# Patient Record
Sex: Female | Born: 1990 | Race: White | Hispanic: No | Marital: Single | Smoking: Never smoker
Health system: Southern US, Community
[De-identification: ages and names within clinical notes are randomized; demographics above are authoritative.]

## PROBLEM LIST (undated history)

## (undated) HISTORY — PX: APPENDECTOMY: SHX54

---

## 2010-03-30 ENCOUNTER — Ambulatory Visit: Payer: Self-pay | Admitting: Internal Medicine

## 2011-09-29 IMAGING — CR DG SHOULDER 3+V*L*
1 series · 3 of 3 positions shown · non-contrast
Comparison: none

REASON FOR EXAM: painful, limited ROM
COMMENTS:

PROCEDURE:     MDR - MDR SHOULDER LEFT COMPLETE  - March 30, 2010 [DATE]
RESULT:     Three views of the left shoulder are submitted. The bones appear
adequately mineralized. I do not see evidence of an acute fracture. The
overlying soft tissues are normal in appearance.

[Series 1: view not recorded · 0.17mm/px · 3 of 3 slices shown]
[im 1/3]
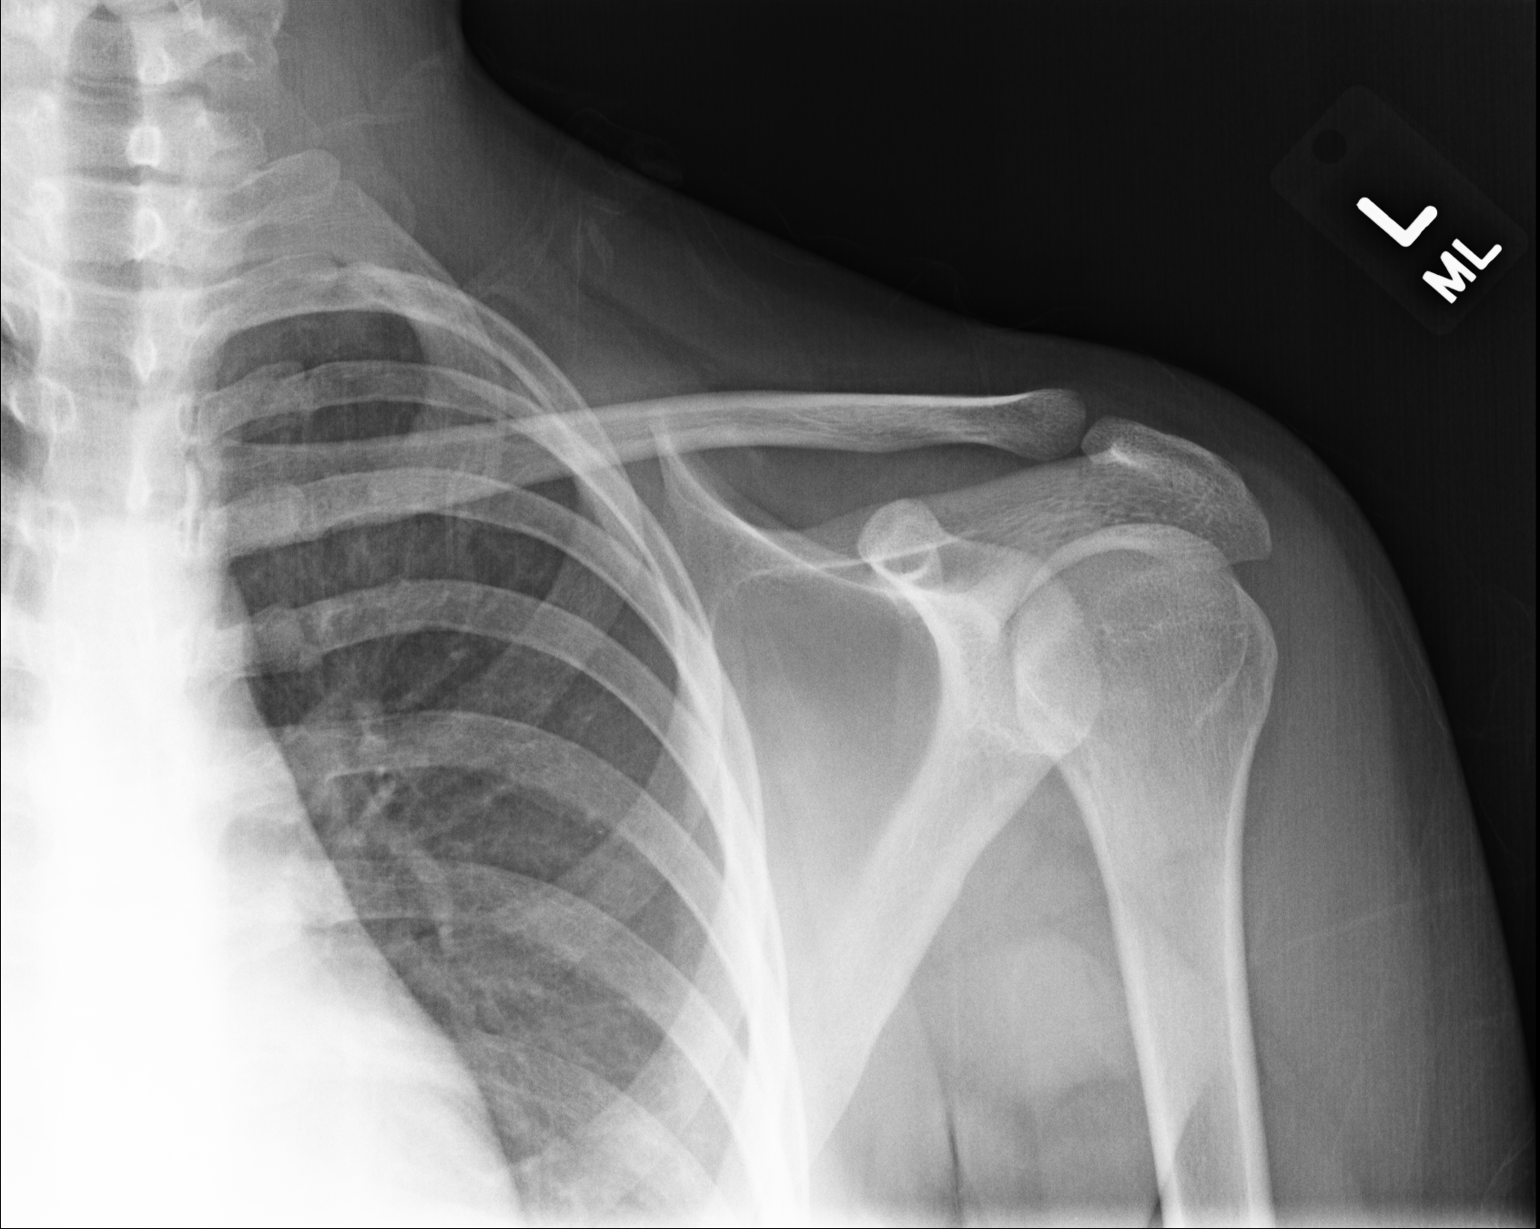
[im 2/3]
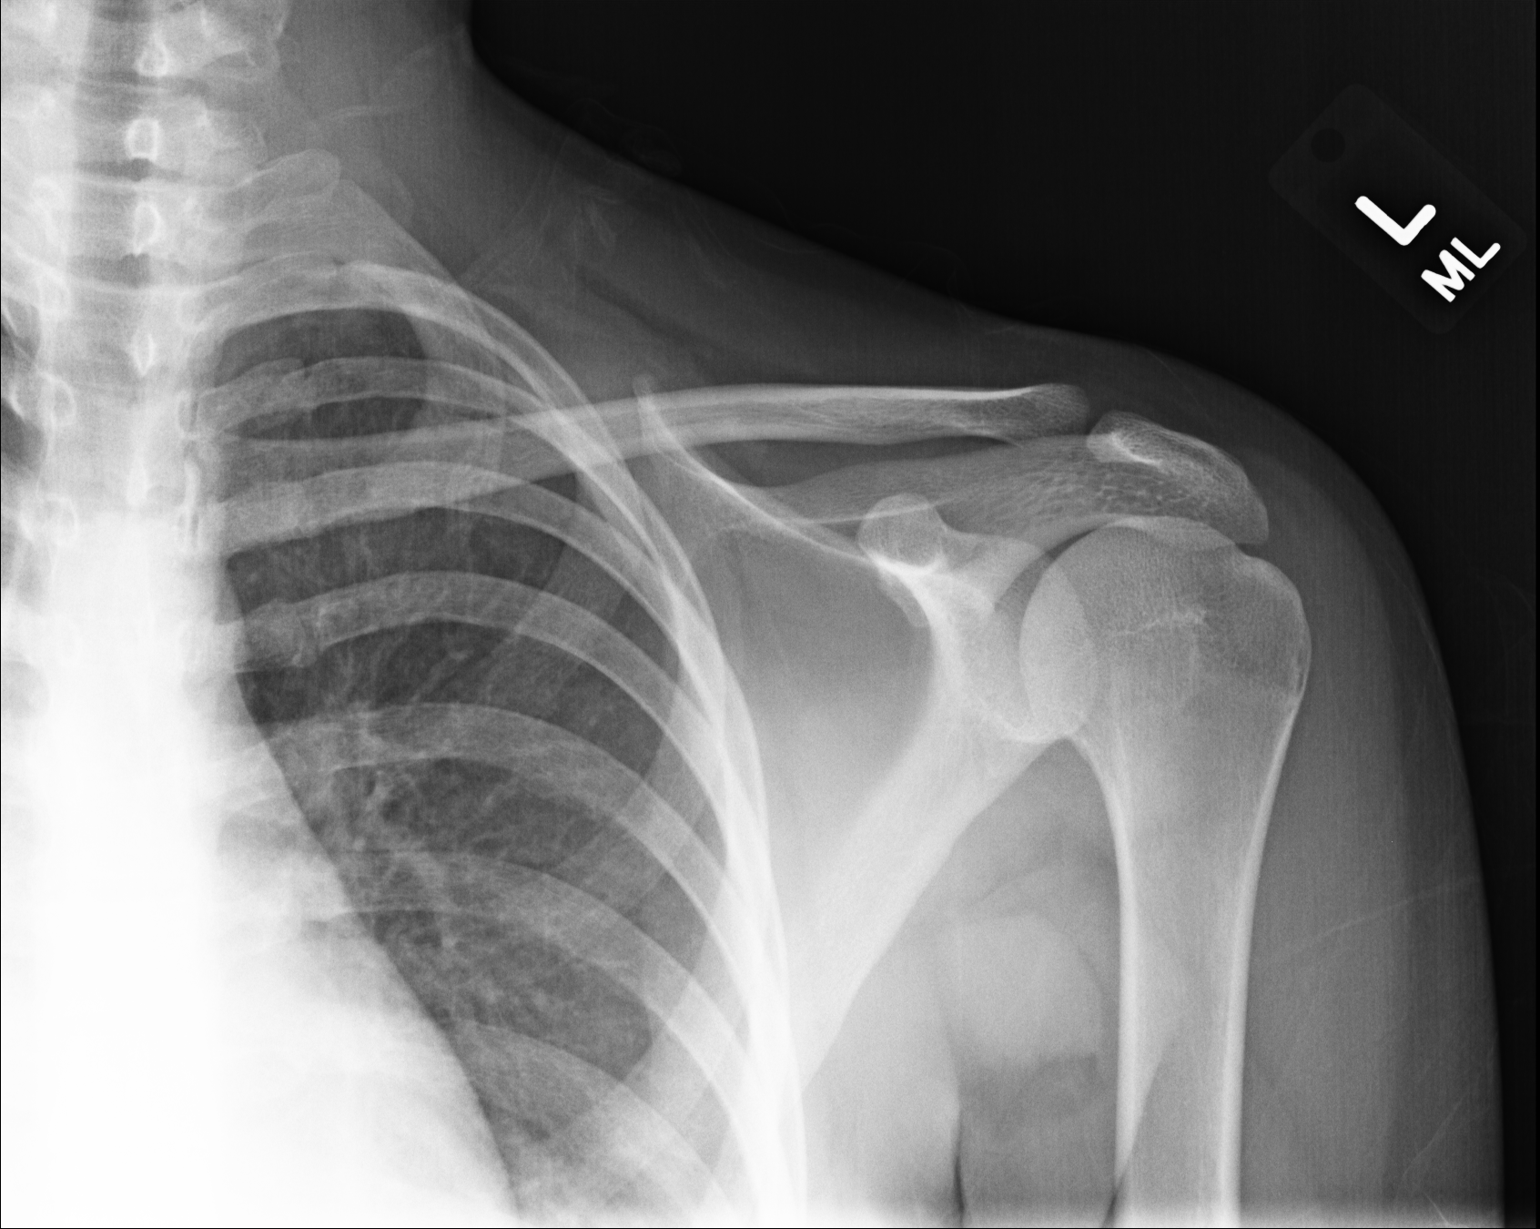
[im 3/3]
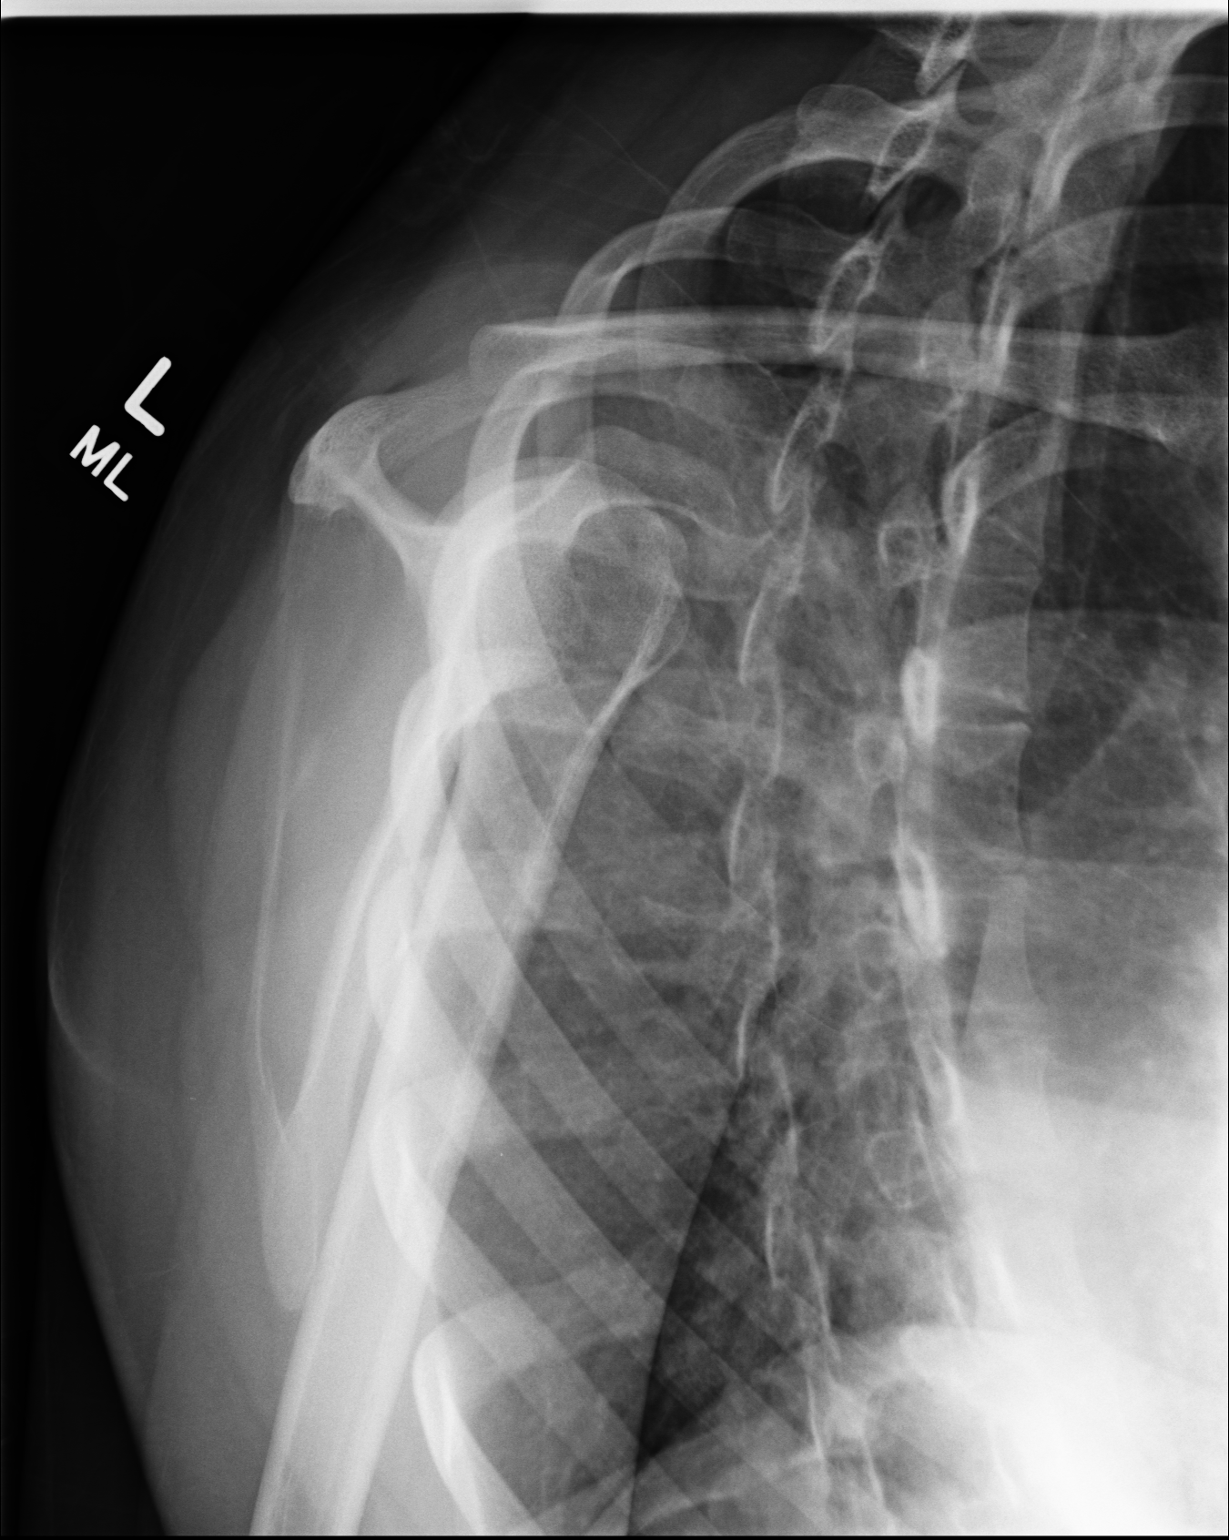

[3 of 3 positions shown; findings below may reference images not displayed]

IMPRESSION: I see no acute bony abnormality of the left shoulder.

## 2019-02-27 ENCOUNTER — Other Ambulatory Visit: Payer: Self-pay

## 2019-02-27 ENCOUNTER — Emergency Department
Admission: EM | Admit: 2019-02-27 | Discharge: 2019-02-27 | Disposition: A | Discharge status: Home / Self Care | Payer: BLUE CROSS/BLUE SHIELD | Attending: Emergency Medicine | Admitting: Emergency Medicine

## 2019-02-27 ENCOUNTER — Encounter: Payer: Self-pay | Admitting: Emergency Medicine

## 2019-02-27 DIAGNOSIS — S61215A Laceration without foreign body of left ring finger without damage to nail, initial encounter: Secondary | ICD-10-CM | POA: Insufficient documentation

## 2019-02-27 DIAGNOSIS — Z23 Encounter for immunization: Secondary | ICD-10-CM | POA: Insufficient documentation

## 2019-02-27 DIAGNOSIS — Y93G1 Activity, food preparation and clean up: Secondary | ICD-10-CM | POA: Insufficient documentation

## 2019-02-27 DIAGNOSIS — Y92000 Kitchen of unspecified non-institutional (private) residence as  the place of occurrence of the external cause: Secondary | ICD-10-CM | POA: Diagnosis not present

## 2019-02-27 DIAGNOSIS — Y999 Unspecified external cause status: Secondary | ICD-10-CM | POA: Diagnosis not present

## 2019-02-27 DIAGNOSIS — W260XXA Contact with knife, initial encounter: Secondary | ICD-10-CM | POA: Diagnosis not present

## 2019-02-27 MED ORDER — TETANUS-DIPHTH-ACELL PERTUSSIS 5-2.5-18.5 LF-MCG/0.5 IM SUSP
0.5000 mL | Freq: Once | INTRAMUSCULAR | Status: AC
  Administered 2019-02-27: 0.5 mL via INTRAMUSCULAR
  Filled 2019-02-27: qty 0.5

## 2019-02-27 NOTE — ED Triage Notes (Signed)
Patient ambulatory to triage with steady gait, without difficulty or distress noted; pt reports cutting finger PTA while cleaning out dishwasher; small lac noted to outer left middle finger tip with scan bleeding

## 2019-02-27 NOTE — ED Provider Notes (Signed)
[facility] Emergency Department Provider Note  ____________________________________________  Time seen: Approximately 10:11 PM  I have reviewed the triage vital signs and the nursing notes.   HISTORY  Chief Complaint Laceration    HPI Patricia Noble is a 28 y.o. female who presents the emergency department complaining of laceration to the ring finger of the left hand.  Patient reports that she was cleaning out her dishwasher when a knife accidentally cut her finger.  Patient sustained a laceration to the medial aspect of the finger just medial to the nailbed.  No active bleeding.  No foreign body.  Full range of motion to the digit.         History reviewed. No pertinent past medical history.  There are no active problems to display for this patient.   Past Surgical History:  Procedure Laterality Date  . APPENDECTOMY      Prior to Admission medications   Not on File    Allergies Patient has no known allergies.  No family history on file.  Social History Social History   Tobacco Use  . Smoking status: Never Smoker  . Smokeless tobacco: Never Used  Substance Use Topics  . Alcohol use: Not on file  . Drug use: Not on file     Review of Systems  Constitutional: No fever/chills Eyes: No visual changes. No discharge ENT: No upper respiratory complaints. Cardiovascular: no chest pain. Respiratory: no cough. No SOB. Gastrointestinal: No abdominal pain.  No nausea, no vomiting.  Musculoskeletal: Negative for musculoskeletal pain. Skin: Positive for laceration to the ring finger of the left hand Neurological: Negative for headaches, focal weakness or numbness. 10-point ROS otherwise negative.  ____________________________________________   PHYSICAL EXAM:  VITAL SIGNS: ED Triage Vitals  Enc Vitals Group     BP 02/27/19 2131 136/81     Pulse Rate 02/27/19 2131 70     Resp 02/27/19 2131 18     Temp 02/27/19 2131 98 F (36.7 C)      Temp Source 02/27/19 2131 Oral     SpO2 02/27/19 2131 99 %     Weight 02/27/19 2130 190 lb (86.2 kg)     Height 02/27/19 2130 5\' 6"  (1.676 m)     Head Circumference --      Peak Flow --      Pain Score 02/27/19 2131 6     Pain Loc --      Pain Edu? --      Excl. in GC? --      Constitutional: Alert and oriented. Well appearing and in no acute distress. Eyes: Conjunctivae are normal. PERRL. EOMI. Head: Atraumatic. Neck: No stridor.    Cardiovascular: Normal rate, regular rhythm. Normal S1 and S2.  Good peripheral circulation. Respiratory: Normal respiratory effort without tachypnea or retractions. Lungs CTAB. Good air entry to the bases with no decreased or absent breath sounds. Musculoskeletal: Full range of motion to all extremities. No gross deformities appreciated. Neurologic:  Normal speech and language. No gross focal neurologic deficits are appreciated.  Skin:  Skin is warm, dry and intact. No rash noted.  Visualization of the ring finger left hand reveals small avulsion flap laceration to the medial aspect of the finger.  This lies just medial to the nailbed.  No active bleeding.  No foreign body.  Full range of motion to the digit. Psychiatric: Mood and affect are normal. Speech and behavior are normal. Patient exhibits appropriate insight and judgement.   ____________________________________________  LABS (all labs ordered are listed, but only abnormal results are displayed)  Labs Reviewed - No data to display ____________________________________________  EKG   ____________________________________________  RADIOLOGY   No results found.  ____________________________________________    PROCEDURES  Procedure(s) performed:    Marland KitchenMarland KitchenLaceration Repair Date/Time: 02/27/2019 10:32 PM Performed by: Racheal Patches, PA-C Authorized by: Racheal Patches, PA-C   Consent:    Consent obtained:  Verbal   Consent given by:  Patient   Risks discussed:   Pain Anesthesia (see MAR for exact dosages):    Anesthesia method:  None Laceration details:    Location:  Finger   Finger location:  L ring finger   Length (cm):  1 Repair type:    Repair type:  Simple Pre-procedure details:    Preparation:  Patient was prepped and draped in usual sterile fashion and imaging obtained to evaluate for foreign bodies Exploration:    Hemostasis achieved with:  Direct pressure   Wound exploration: wound explored through full range of motion and entire depth of wound probed and visualized     Wound extent: no foreign bodies/material noted, no muscle damage noted, no nerve damage noted, no tendon damage noted, no underlying fracture noted and no vascular damage noted     Contaminated: no   Treatment:    Area cleansed with:  Shur-Clens and saline   Amount of cleaning:  Standard Skin repair:    Repair method:  Tissue adhesive Approximation:    Approximation:  Close Post-procedure details:    Dressing:  Open (no dressing)   Patient tolerance of procedure:  Tolerated well, no immediate complications      Medications  Tdap (BOOSTRIX) injection 0.5 mL (has no administration in time range)     ____________________________________________   INITIAL IMPRESSION / ASSESSMENT AND PLAN / ED COURSE  Pertinent labs & imaging results that were available during my care of the patient were reviewed by me and considered in my medical decision making (see chart for details).  Review of the [place] CSRS was performed in accordance of the [facility] prior to dispensing any controlled drugs.           Patient's diagnosis is consistent with laceration of the finger.  Patient presented to emergency department with a laceration to the ring finger left hand.  This was a avulsion/flap laceration.  This was secured using Dermabond.  No complications.  Wound care instructions discussed with patient.  Follow-up with primary care as needed.  No medications at this time.  Tetanus  shot updated today. Patient is given ED precautions to return to the ED for any worsening or new symptoms.     ____________________________________________  FINAL CLINICAL IMPRESSION(S) / ED DIAGNOSES  Final diagnoses:  Laceration of left ring finger without foreign body without damage to nail, initial encounter      NEW MEDICATIONS STARTED DURING THIS VISIT:  ED Discharge Orders    None          This chart was dictated using voice recognition software/Dragon. Despite best efforts to proofread, errors can occur which can change the meaning. Any change was purely unintentional.    Racheal Patches, PA-C 02/27/19 2233    Minna Antis, MD 02/27/19 2241
